# Patient Record
Sex: Female | Born: 1935 | Race: White | Hispanic: No | Marital: Married
Health system: Southern US, Community
[De-identification: ages and names within clinical notes are randomized; demographics above are authoritative.]

## PROBLEM LIST (undated history)

## (undated) HISTORY — PX: BREAST BIOPSY: SHX20

---

## 1997-12-07 ENCOUNTER — Ambulatory Visit (HOSPITAL_BASED_OUTPATIENT_CLINIC_OR_DEPARTMENT_OTHER): Admission: RE | Admit: 1997-12-07 | Discharge: 1997-12-07 | Payer: Self-pay | Admitting: Orthopedic Surgery

## 1998-05-04 ENCOUNTER — Other Ambulatory Visit: Admission: RE | Admit: 1998-05-04 | Discharge: 1998-05-04 | Payer: Self-pay | Admitting: Obstetrics & Gynecology

## 1998-08-23 ENCOUNTER — Ambulatory Visit (HOSPITAL_BASED_OUTPATIENT_CLINIC_OR_DEPARTMENT_OTHER): Admission: RE | Admit: 1998-08-23 | Discharge: 1998-08-23 | Payer: Self-pay | Admitting: Orthopedic Surgery

## 1999-01-10 ENCOUNTER — Other Ambulatory Visit: Admission: RE | Admit: 1999-01-10 | Discharge: 1999-01-10 | Payer: Self-pay | Admitting: Obstetrics & Gynecology

## 1999-06-06 ENCOUNTER — Encounter: Admission: RE | Admit: 1999-06-06 | Discharge: 1999-06-06 | Payer: Self-pay | Admitting: Obstetrics & Gynecology

## 1999-06-06 ENCOUNTER — Encounter: Payer: Self-pay | Admitting: Obstetrics & Gynecology

## 2000-03-20 ENCOUNTER — Other Ambulatory Visit: Admission: RE | Admit: 2000-03-20 | Discharge: 2000-03-20 | Payer: Self-pay | Admitting: Obstetrics & Gynecology

## 2000-07-26 ENCOUNTER — Encounter: Payer: Self-pay | Admitting: Obstetrics & Gynecology

## 2000-07-26 ENCOUNTER — Encounter: Admission: RE | Admit: 2000-07-26 | Discharge: 2000-07-26 | Payer: Self-pay | Admitting: Obstetrics & Gynecology

## 2000-09-27 ENCOUNTER — Ambulatory Visit (HOSPITAL_BASED_OUTPATIENT_CLINIC_OR_DEPARTMENT_OTHER): Admission: RE | Admit: 2000-09-27 | Discharge: 2000-09-27 | Payer: Self-pay | Admitting: Orthopedic Surgery

## 2001-06-10 ENCOUNTER — Other Ambulatory Visit: Admission: RE | Admit: 2001-06-10 | Discharge: 2001-06-10 | Payer: Self-pay | Admitting: Obstetrics & Gynecology

## 2001-08-26 ENCOUNTER — Encounter: Payer: Self-pay | Admitting: Obstetrics & Gynecology

## 2001-08-26 ENCOUNTER — Encounter: Admission: RE | Admit: 2001-08-26 | Discharge: 2001-08-26 | Payer: Self-pay | Admitting: Obstetrics & Gynecology

## 2002-06-16 ENCOUNTER — Other Ambulatory Visit: Admission: RE | Admit: 2002-06-16 | Discharge: 2002-06-16 | Payer: Self-pay | Admitting: Obstetrics & Gynecology

## 2002-09-15 ENCOUNTER — Encounter: Admission: RE | Admit: 2002-09-15 | Discharge: 2002-09-15 | Payer: Self-pay | Admitting: Obstetrics & Gynecology

## 2002-09-15 ENCOUNTER — Encounter: Payer: Self-pay | Admitting: Obstetrics & Gynecology

## 2003-10-05 ENCOUNTER — Encounter: Admission: RE | Admit: 2003-10-05 | Discharge: 2003-10-05 | Payer: Self-pay | Admitting: Obstetrics & Gynecology

## 2004-12-13 ENCOUNTER — Other Ambulatory Visit: Admission: RE | Admit: 2004-12-13 | Discharge: 2004-12-13 | Payer: Self-pay | Admitting: Obstetrics & Gynecology

## 2004-12-18 ENCOUNTER — Encounter: Admission: RE | Admit: 2004-12-18 | Discharge: 2004-12-18 | Payer: Self-pay | Admitting: Obstetrics & Gynecology

## 2005-01-18 ENCOUNTER — Encounter: Admission: RE | Admit: 2005-01-18 | Discharge: 2005-01-18 | Payer: Self-pay | Admitting: Obstetrics & Gynecology

## 2005-12-07 ENCOUNTER — Encounter: Admission: RE | Admit: 2005-12-07 | Discharge: 2005-12-07 | Payer: Self-pay | Admitting: Obstetrics & Gynecology

## 2005-12-11 ENCOUNTER — Encounter: Admission: RE | Admit: 2005-12-11 | Discharge: 2005-12-11 | Payer: Self-pay | Admitting: Obstetrics & Gynecology

## 2005-12-11 ENCOUNTER — Encounter (INDEPENDENT_AMBULATORY_CARE_PROVIDER_SITE_OTHER): Payer: Self-pay | Admitting: Specialist

## 2005-12-19 ENCOUNTER — Encounter: Admission: RE | Admit: 2005-12-19 | Discharge: 2005-12-19 | Payer: Self-pay | Admitting: Obstetrics & Gynecology

## 2005-12-26 ENCOUNTER — Encounter: Admission: RE | Admit: 2005-12-26 | Discharge: 2005-12-26 | Payer: Self-pay | Admitting: General Surgery

## 2006-01-03 ENCOUNTER — Encounter: Admission: RE | Admit: 2006-01-03 | Discharge: 2006-01-03 | Payer: Self-pay | Admitting: General Surgery

## 2006-01-15 ENCOUNTER — Encounter: Admission: RE | Admit: 2006-01-15 | Discharge: 2006-01-15 | Payer: Self-pay | Admitting: General Surgery

## 2006-01-15 ENCOUNTER — Encounter (INDEPENDENT_AMBULATORY_CARE_PROVIDER_SITE_OTHER): Payer: Self-pay | Admitting: *Deleted

## 2006-01-15 ENCOUNTER — Other Ambulatory Visit: Admission: RE | Admit: 2006-01-15 | Discharge: 2006-01-15 | Payer: Self-pay | Admitting: Diagnostic Radiology

## 2006-02-15 ENCOUNTER — Ambulatory Visit (HOSPITAL_BASED_OUTPATIENT_CLINIC_OR_DEPARTMENT_OTHER): Admission: RE | Admit: 2006-02-15 | Discharge: 2006-02-15 | Payer: Self-pay | Admitting: Orthopedic Surgery

## 2006-05-15 ENCOUNTER — Encounter: Admission: RE | Admit: 2006-05-15 | Discharge: 2006-05-15 | Payer: Self-pay | Admitting: General Surgery

## 2006-11-22 ENCOUNTER — Encounter: Admission: RE | Admit: 2006-11-22 | Discharge: 2006-11-22 | Payer: Self-pay | Admitting: General Surgery

## 2006-12-12 ENCOUNTER — Encounter: Admission: RE | Admit: 2006-12-12 | Discharge: 2006-12-12 | Payer: Self-pay | Admitting: Obstetrics & Gynecology

## 2007-07-01 ENCOUNTER — Encounter: Admission: RE | Admit: 2007-07-01 | Discharge: 2007-07-01 | Payer: Self-pay | Admitting: Geriatric Medicine

## 2008-01-07 ENCOUNTER — Encounter: Admission: RE | Admit: 2008-01-07 | Discharge: 2008-01-07 | Payer: Self-pay | Admitting: Obstetrics & Gynecology

## 2008-07-06 ENCOUNTER — Encounter: Admission: RE | Admit: 2008-07-06 | Discharge: 2008-07-06 | Payer: Self-pay | Admitting: Geriatric Medicine

## 2008-07-28 ENCOUNTER — Encounter: Admission: RE | Admit: 2008-07-28 | Discharge: 2008-07-28 | Payer: Self-pay | Admitting: Geriatric Medicine

## 2008-07-28 ENCOUNTER — Encounter (INDEPENDENT_AMBULATORY_CARE_PROVIDER_SITE_OTHER): Payer: Self-pay | Admitting: Interventional Radiology

## 2008-07-28 ENCOUNTER — Other Ambulatory Visit: Admission: RE | Admit: 2008-07-28 | Discharge: 2008-07-28 | Payer: Self-pay | Admitting: Interventional Radiology

## 2009-01-26 ENCOUNTER — Encounter: Admission: RE | Admit: 2009-01-26 | Discharge: 2009-01-26 | Payer: Self-pay | Admitting: Geriatric Medicine

## 2009-07-08 ENCOUNTER — Encounter: Admission: RE | Admit: 2009-07-08 | Discharge: 2009-07-08 | Payer: Self-pay | Admitting: Geriatric Medicine

## 2010-01-26 ENCOUNTER — Other Ambulatory Visit: Payer: Self-pay | Admitting: Geriatric Medicine

## 2010-01-26 DIAGNOSIS — Z1239 Encounter for other screening for malignant neoplasm of breast: Secondary | ICD-10-CM

## 2010-02-01 ENCOUNTER — Ambulatory Visit
Admission: RE | Admit: 2010-02-01 | Discharge: 2010-02-01 | Disposition: A | Discharge status: Home / Self Care | Payer: MEDICARE | Source: Ambulatory Visit | Attending: Geriatric Medicine | Admitting: Geriatric Medicine

## 2010-02-01 DIAGNOSIS — Z1239 Encounter for other screening for malignant neoplasm of breast: Secondary | ICD-10-CM

## 2010-05-19 NOTE — Op Note (Signed)
NAMELORELLA, GOMEZ            ACCOUNT NO.:  [id number]   MEDICAL RECORD NO.:  [id number]          PATIENT TYPE:  AMB   LOCATION:  DSC                          FACILITY:  [facility]   PHYSICIAN:  Katy Fitch. Sypher, M.D. DATE OF BIRTH:  1934/10/01   DATE OF PROCEDURE:  02/15/2006  DATE OF DISCHARGE:                               OPERATIVE REPORT   PREOPERATIVE DIAGNOSIS:  Chronic stenosing tenosynovitis, left first  dorsal compartment.   POSTOPERATIVE DIAGNOSIS:  Chronic stenosing tenosynovitis, left first  dorsal compartment.   OPERATION:  Release of first dorsal compartment.   OPERATIONS:  Katy Fitch. Sypher, MD   ASSISTANT:  Annye Rusk PA-C   ANESTHESIA:  2% lidocaine field block of left first dorsal compartment  supplemented by IV sedation, supervising anesthesiologist is Dr. Gypsy Balsam.   INDICATIONS:  Mackenzie Shaw is a 76 year old woman referred for  evaluation and management of a painful left wrist.  Clinical examination  revealed swelling of the first dorsal compartment.  Due to a failure to  respond to nonoperative measures, she is now brought to the operating  room for release of her left first dorsal compartment.   PROCEDURE:  Jenel Gierke was brought to the operating room and  placed in supine position on the table.   Following the provision of light sedation, the left arm was prepped with  Betadine soap and solution and sterilely draped.  Lidocaine 2% was  infiltrated into the skin proximal to the area of intended incision and  the first dorsal compartment infiltrated directly with 2% lidocaine.   After 5 minutes, excellent anesthesia was achieved.  The arm was  exsanguinated with an Esmarch bandage and an arterial tourniquet on the  proximal brachium inflated to 230 mmHg.   The procedure commenced with short transverse incision directly over the  thickened first dorsal compartment.  The subcutaneous tissues were  carefully divided taking care, taking care  to use a Freer to gently  elevate the radial superficial sensory branches.   The compartment was split longitudinally and released proximally and  distally.  The wall thickness was increased to 3 mm.   There were two slips of the abductor pollicis longus noted and a single  slip of the extensor pollicis brevis.  There was a septum between the  APL and EPB tendons.  The septum was resected.  Thereafter free range of  motion of the wrist and thumb was achieved.   The wound was then repaired with intradermal 3-0 Prolene and Steri-  Strips.  There were no apparent complications.   For aftercare Ms. Rahal is placed in an Ace wrap.  She is advised to  begin immediate range of motion excises.  We will see her back in follow-  up in 1 week for suture removal.      Katy Fitch. Sypher, M.D.  Electronically Signed     RVS/MEDQ  D:  02/15/2006  T:  02/15/2006  Job:  [id number]

## 2010-05-19 NOTE — Op Note (Signed)
[place]  Patient:    Mackenzie Shaw, Mackenzie Shaw Visit Number: [id number] MRN: [id number]          Service Type: DSU Location: [facility] Attending Physician:  Susa Day Dictated by:   Katy Fitch Naaman Plummer., M.D. Proc. Date: 09/27/00 Admit Date:  09/27/2000   CC:         Katy Fitch. Sypher, Montez Hageman., M.D. 2 copies   Operative Report  PREOPERATIVE DIAGNOSIS:  Chronic stenosing tenosynovitis of the right first dorsal compartment unresponsive to activity modifications, splinting, and anti-inflammatory medications.  POSTOPERATIVE DIAGNOSIS:  Chronic stenosing tenosynovitis of the right first dorsal compartment unresponsive to activity modifications, splinting, and anti-inflammatory medications.  OPERATION:  Release of right first dorsal compartment.  SURGEON:  Katy Fitch. Sypher, Montez Hageman., M.D.  ASSISTANT:  Molly Maduro ______, P.A.-C  ANESTHESIA:  Marcaine 0.25% and 2% lidocaine field block of first dorsal compartment, supplemented with IV sedation, Dr. Gelene Mink.  INDICATIONS FOR PROCEDURE:  Mackenzie Shaw is a 76 year old woman with a history of chronic stenosing tenosynovitis involving the right first dorsal compartment.  She has not responded to non-operative measures, therefore, she is brought to the operating room at this time for release of the first dorsal compartment.  DESCRIPTION OF PROCEDURE:  Mackenzie Shaw is brought to the operating room and placed in supine position on the operating table.  Following light sedation, the right arm was prepped with Betadine solution and sterilely draped.  Following exsanguination of the limb with the Esmarch bandage, the tourniquet was inflated to 240 mmHg.  Procedure commenced with a short incision directly over the palpably enlarged compartment.  Subcutaneous tissues were carefully dividing, taking care to identify and gently retract the radial sensory branches.  The compartment was quite swollen,  fibrotic, and covered with an inflammatory tenosynovium.  This was cleared with the [facility] elevator followed by the use of the scalpel and scissors to release the first dorsal compartment.  There was a minimal septum between the abductor pollicis longus tendon slips and the extensor pollicis brevis.  Once the compartment was released, there was noted to be marked instriction of the abductor pollicis tendon slips.  The wound was inspected for bleeding points.  Care was taken to visualize and carefully protect the radial sensory branches throughout dissection.  The wound was then repaired with Prolene suture.  A compressive dressing was applied with xeroform, sterile gauze, and Ace wrap.  Mackenzie Shaw is advised to begin immediate range of motion exercises.  She will return to the office in 7 to 10 days for suture removal, and advancement to a therapy program. Dictated by:   Katy Fitch. Naaman Plummer., M.D. Attending Physician:  Susa Day DD:  09/27/00 TD:  09/27/00 Job: [id number]

## 2010-07-11 ENCOUNTER — Other Ambulatory Visit: Payer: Self-pay | Admitting: Geriatric Medicine

## 2010-07-11 DIAGNOSIS — E041 Nontoxic single thyroid nodule: Secondary | ICD-10-CM

## 2010-07-24 ENCOUNTER — Ambulatory Visit
Admission: RE | Admit: 2010-07-24 | Discharge: 2010-07-24 | Disposition: A | Discharge status: Home / Self Care | Payer: Medicare Other | Source: Ambulatory Visit | Attending: Geriatric Medicine | Admitting: Geriatric Medicine

## 2010-07-24 DIAGNOSIS — E041 Nontoxic single thyroid nodule: Secondary | ICD-10-CM

## 2011-01-22 ENCOUNTER — Ambulatory Visit (INDEPENDENT_AMBULATORY_CARE_PROVIDER_SITE_OTHER): Payer: Medicare Other

## 2011-01-22 DIAGNOSIS — R05 Cough: Secondary | ICD-10-CM

## 2011-01-22 DIAGNOSIS — J019 Acute sinusitis, unspecified: Secondary | ICD-10-CM

## 2011-01-22 DIAGNOSIS — J069 Acute upper respiratory infection, unspecified: Secondary | ICD-10-CM

## 2011-05-08 ENCOUNTER — Other Ambulatory Visit: Payer: Self-pay

## 2011-07-12 ENCOUNTER — Other Ambulatory Visit (HOSPITAL_COMMUNITY)
Admission: RE | Admit: 2011-07-12 | Discharge: 2011-07-12 | Disposition: A | Discharge status: Home / Self Care | Payer: Medicare Other | Source: Ambulatory Visit | Attending: Geriatric Medicine | Admitting: Geriatric Medicine

## 2011-07-12 ENCOUNTER — Other Ambulatory Visit: Payer: Self-pay | Admitting: Geriatric Medicine

## 2011-07-12 DIAGNOSIS — Z1151 Encounter for screening for human papillomavirus (HPV): Secondary | ICD-10-CM | POA: Insufficient documentation

## 2011-07-12 DIAGNOSIS — Z01419 Encounter for gynecological examination (general) (routine) without abnormal findings: Secondary | ICD-10-CM | POA: Insufficient documentation

## 2011-07-13 ENCOUNTER — Other Ambulatory Visit: Payer: Self-pay | Admitting: Geriatric Medicine

## 2011-07-13 DIAGNOSIS — E041 Nontoxic single thyroid nodule: Secondary | ICD-10-CM

## 2011-07-17 ENCOUNTER — Ambulatory Visit
Admission: RE | Admit: 2011-07-17 | Discharge: 2011-07-17 | Disposition: A | Discharge status: Home / Self Care | Payer: Medicare Other | Source: Ambulatory Visit | Attending: Geriatric Medicine | Admitting: Geriatric Medicine

## 2011-07-17 DIAGNOSIS — E041 Nontoxic single thyroid nodule: Secondary | ICD-10-CM

## 2011-08-06 ENCOUNTER — Other Ambulatory Visit: Payer: Self-pay | Admitting: Geriatric Medicine

## 2011-08-06 DIAGNOSIS — Z1231 Encounter for screening mammogram for malignant neoplasm of breast: Secondary | ICD-10-CM

## 2011-08-22 ENCOUNTER — Ambulatory Visit
Admission: RE | Admit: 2011-08-22 | Discharge: 2011-08-22 | Disposition: A | Discharge status: Home / Self Care | Payer: Medicare Other | Source: Ambulatory Visit | Attending: Geriatric Medicine | Admitting: Geriatric Medicine

## 2011-08-22 ENCOUNTER — Other Ambulatory Visit: Payer: Self-pay | Admitting: Geriatric Medicine

## 2011-08-22 DIAGNOSIS — Z1231 Encounter for screening mammogram for malignant neoplasm of breast: Secondary | ICD-10-CM

## 2011-10-25 ENCOUNTER — Other Ambulatory Visit: Payer: Self-pay | Admitting: *Deleted

## 2012-01-24 ENCOUNTER — Other Ambulatory Visit: Payer: Self-pay | Admitting: Oncology

## 2012-07-15 ENCOUNTER — Other Ambulatory Visit: Payer: Self-pay | Admitting: Geriatric Medicine

## 2012-07-15 DIAGNOSIS — E041 Nontoxic single thyroid nodule: Secondary | ICD-10-CM

## 2012-07-17 ENCOUNTER — Ambulatory Visit
Admission: RE | Admit: 2012-07-17 | Discharge: 2012-07-17 | Disposition: A | Discharge status: Home / Self Care | Payer: Medicare Other | Source: Ambulatory Visit | Attending: Geriatric Medicine | Admitting: Geriatric Medicine

## 2012-07-17 DIAGNOSIS — E041 Nontoxic single thyroid nodule: Secondary | ICD-10-CM

## 2012-11-06 ENCOUNTER — Other Ambulatory Visit: Payer: Self-pay

## 2012-11-06 DIAGNOSIS — Z1231 Encounter for screening mammogram for malignant neoplasm of breast: Secondary | ICD-10-CM

## 2012-11-26 ENCOUNTER — Ambulatory Visit: Payer: Medicare Other

## 2012-12-11 ENCOUNTER — Ambulatory Visit
Admission: RE | Admit: 2012-12-11 | Discharge: 2012-12-11 | Disposition: A | Discharge status: Home / Self Care | Payer: Medicare Other | Source: Ambulatory Visit

## 2012-12-11 DIAGNOSIS — Z1231 Encounter for screening mammogram for malignant neoplasm of breast: Secondary | ICD-10-CM

## 2012-12-30 ENCOUNTER — Other Ambulatory Visit: Payer: Self-pay | Admitting: Oncology

## 2013-07-24 ENCOUNTER — Other Ambulatory Visit: Payer: Self-pay | Admitting: Geriatric Medicine

## 2013-07-24 DIAGNOSIS — E041 Nontoxic single thyroid nodule: Secondary | ICD-10-CM

## 2013-07-30 ENCOUNTER — Ambulatory Visit
Admission: RE | Admit: 2013-07-30 | Discharge: 2013-07-30 | Disposition: A | Discharge status: Home / Self Care | Payer: Medicare Other | Source: Ambulatory Visit | Attending: Geriatric Medicine | Admitting: Geriatric Medicine

## 2013-07-30 DIAGNOSIS — E041 Nontoxic single thyroid nodule: Secondary | ICD-10-CM

## 2014-07-14 ENCOUNTER — Other Ambulatory Visit: Payer: Self-pay

## 2014-07-14 DIAGNOSIS — Z803 Family history of malignant neoplasm of breast: Secondary | ICD-10-CM

## 2014-07-14 DIAGNOSIS — Z1231 Encounter for screening mammogram for malignant neoplasm of breast: Secondary | ICD-10-CM

## 2014-07-21 ENCOUNTER — Ambulatory Visit
Admission: RE | Admit: 2014-07-21 | Discharge: 2014-07-21 | Disposition: A | Discharge status: Home / Self Care | Payer: PPO | Source: Ambulatory Visit

## 2014-07-21 DIAGNOSIS — Z803 Family history of malignant neoplasm of breast: Secondary | ICD-10-CM

## 2014-07-21 DIAGNOSIS — Z1231 Encounter for screening mammogram for malignant neoplasm of breast: Secondary | ICD-10-CM

## 2015-07-21 DIAGNOSIS — S5001XA Contusion of right elbow, initial encounter: Secondary | ICD-10-CM | POA: Diagnosis not present

## 2015-07-21 DIAGNOSIS — S50319A Abrasion of unspecified elbow, initial encounter: Secondary | ICD-10-CM | POA: Diagnosis not present

## 2015-07-21 DIAGNOSIS — S0100XA Unspecified open wound of scalp, initial encounter: Secondary | ICD-10-CM | POA: Diagnosis not present

## 2015-07-21 DIAGNOSIS — M79644 Pain in right finger(s): Secondary | ICD-10-CM | POA: Diagnosis not present

## 2015-07-28 DIAGNOSIS — S60419D Abrasion of unspecified finger, subsequent encounter: Secondary | ICD-10-CM | POA: Diagnosis not present

## 2015-07-28 DIAGNOSIS — S66911D Strain of unspecified muscle, fascia and tendon at wrist and hand level, right hand, subsequent encounter: Secondary | ICD-10-CM | POA: Diagnosis not present

## 2015-07-28 DIAGNOSIS — S0100XD Unspecified open wound of scalp, subsequent encounter: Secondary | ICD-10-CM | POA: Diagnosis not present

## 2015-08-01 ENCOUNTER — Other Ambulatory Visit: Payer: Self-pay | Admitting: Geriatric Medicine

## 2015-08-01 DIAGNOSIS — D7589 Other specified diseases of blood and blood-forming organs: Secondary | ICD-10-CM | POA: Diagnosis not present

## 2015-08-01 DIAGNOSIS — Z23 Encounter for immunization: Secondary | ICD-10-CM | POA: Diagnosis not present

## 2015-08-01 DIAGNOSIS — E041 Nontoxic single thyroid nodule: Secondary | ICD-10-CM

## 2015-08-01 DIAGNOSIS — M542 Cervicalgia: Secondary | ICD-10-CM | POA: Diagnosis not present

## 2015-08-01 DIAGNOSIS — Z0000 Encounter for general adult medical examination without abnormal findings: Secondary | ICD-10-CM | POA: Diagnosis not present

## 2015-08-01 DIAGNOSIS — Z1389 Encounter for screening for other disorder: Secondary | ICD-10-CM | POA: Diagnosis not present

## 2015-08-01 DIAGNOSIS — Z79899 Other long term (current) drug therapy: Secondary | ICD-10-CM | POA: Diagnosis not present

## 2015-08-23 ENCOUNTER — Ambulatory Visit
Admission: RE | Admit: 2015-08-23 | Discharge: 2015-08-23 | Disposition: A | Discharge status: Home / Self Care | Payer: PPO | Source: Ambulatory Visit | Attending: Geriatric Medicine | Admitting: Geriatric Medicine

## 2015-08-23 DIAGNOSIS — E041 Nontoxic single thyroid nodule: Secondary | ICD-10-CM | POA: Diagnosis not present

## 2015-08-26 DIAGNOSIS — H04123 Dry eye syndrome of bilateral lacrimal glands: Secondary | ICD-10-CM | POA: Diagnosis not present

## 2015-09-16 DIAGNOSIS — N3941 Urge incontinence: Secondary | ICD-10-CM | POA: Diagnosis not present

## 2015-09-16 DIAGNOSIS — N3281 Overactive bladder: Secondary | ICD-10-CM | POA: Diagnosis not present

## 2015-09-20 DIAGNOSIS — M79644 Pain in right finger(s): Secondary | ICD-10-CM | POA: Diagnosis not present

## 2015-10-13 DIAGNOSIS — Z23 Encounter for immunization: Secondary | ICD-10-CM | POA: Diagnosis not present

## 2016-02-06 DIAGNOSIS — H524 Presbyopia: Secondary | ICD-10-CM | POA: Diagnosis not present

## 2016-02-06 DIAGNOSIS — H04123 Dry eye syndrome of bilateral lacrimal glands: Secondary | ICD-10-CM | POA: Diagnosis not present

## 2016-02-06 DIAGNOSIS — D3131 Benign neoplasm of right choroid: Secondary | ICD-10-CM | POA: Diagnosis not present

## 2016-03-19 DIAGNOSIS — R238 Other skin changes: Secondary | ICD-10-CM | POA: Diagnosis not present

## 2016-03-19 DIAGNOSIS — S61210A Laceration without foreign body of right index finger without damage to nail, initial encounter: Secondary | ICD-10-CM | POA: Diagnosis not present

## 2016-05-25 DIAGNOSIS — N3281 Overactive bladder: Secondary | ICD-10-CM | POA: Diagnosis not present

## 2016-05-25 DIAGNOSIS — N3941 Urge incontinence: Secondary | ICD-10-CM | POA: Diagnosis not present

## 2016-08-08 ENCOUNTER — Other Ambulatory Visit: Payer: Self-pay | Admitting: Geriatric Medicine

## 2016-08-08 DIAGNOSIS — E041 Nontoxic single thyroid nodule: Secondary | ICD-10-CM | POA: Diagnosis not present

## 2016-08-08 DIAGNOSIS — L989 Disorder of the skin and subcutaneous tissue, unspecified: Secondary | ICD-10-CM | POA: Diagnosis not present

## 2016-08-08 DIAGNOSIS — Z0000 Encounter for general adult medical examination without abnormal findings: Secondary | ICD-10-CM | POA: Diagnosis not present

## 2016-08-08 DIAGNOSIS — Z1389 Encounter for screening for other disorder: Secondary | ICD-10-CM | POA: Diagnosis not present

## 2016-08-08 DIAGNOSIS — Z79899 Other long term (current) drug therapy: Secondary | ICD-10-CM | POA: Diagnosis not present

## 2016-08-08 DIAGNOSIS — D7589 Other specified diseases of blood and blood-forming organs: Secondary | ICD-10-CM | POA: Diagnosis not present

## 2016-08-08 DIAGNOSIS — Z23 Encounter for immunization: Secondary | ICD-10-CM | POA: Diagnosis not present

## 2016-08-10 ENCOUNTER — Ambulatory Visit
Admission: RE | Admit: 2016-08-10 | Discharge: 2016-08-10 | Disposition: A | Discharge status: Home / Self Care | Payer: PPO | Source: Ambulatory Visit | Attending: Geriatric Medicine | Admitting: Geriatric Medicine

## 2016-08-10 DIAGNOSIS — E041 Nontoxic single thyroid nodule: Secondary | ICD-10-CM

## 2016-08-10 DIAGNOSIS — E042 Nontoxic multinodular goiter: Secondary | ICD-10-CM | POA: Diagnosis not present

## 2016-08-20 ENCOUNTER — Other Ambulatory Visit: Payer: Self-pay | Admitting: Geriatric Medicine

## 2016-08-20 DIAGNOSIS — Z1231 Encounter for screening mammogram for malignant neoplasm of breast: Secondary | ICD-10-CM

## 2016-08-21 DIAGNOSIS — Z85828 Personal history of other malignant neoplasm of skin: Secondary | ICD-10-CM | POA: Diagnosis not present

## 2016-08-21 DIAGNOSIS — L570 Actinic keratosis: Secondary | ICD-10-CM | POA: Diagnosis not present

## 2016-08-29 ENCOUNTER — Ambulatory Visit
Admission: RE | Admit: 2016-08-29 | Discharge: 2016-08-29 | Disposition: A | Discharge status: Home / Self Care | Payer: PPO | Source: Ambulatory Visit | Attending: Geriatric Medicine | Admitting: Geriatric Medicine

## 2016-08-29 DIAGNOSIS — Z1231 Encounter for screening mammogram for malignant neoplasm of breast: Secondary | ICD-10-CM

## 2016-09-13 DIAGNOSIS — Z23 Encounter for immunization: Secondary | ICD-10-CM | POA: Diagnosis not present

## 2016-10-24 ENCOUNTER — Ambulatory Visit
Admission: RE | Admit: 2016-10-24 | Discharge: 2016-10-24 | Disposition: A | Discharge status: Home / Self Care | Payer: PPO | Source: Ambulatory Visit | Attending: Geriatric Medicine | Admitting: Geriatric Medicine

## 2016-10-24 ENCOUNTER — Other Ambulatory Visit: Payer: Self-pay | Admitting: Geriatric Medicine

## 2016-10-24 DIAGNOSIS — F4329 Adjustment disorder with other symptoms: Secondary | ICD-10-CM | POA: Diagnosis not present

## 2016-10-24 DIAGNOSIS — R05 Cough: Secondary | ICD-10-CM

## 2016-10-24 DIAGNOSIS — R059 Cough, unspecified: Secondary | ICD-10-CM

## 2017-08-14 DIAGNOSIS — E039 Hypothyroidism, unspecified: Secondary | ICD-10-CM | POA: Diagnosis not present

## 2017-08-14 DIAGNOSIS — K219 Gastro-esophageal reflux disease without esophagitis: Secondary | ICD-10-CM | POA: Diagnosis not present

## 2017-08-14 DIAGNOSIS — D7589 Other specified diseases of blood and blood-forming organs: Secondary | ICD-10-CM | POA: Diagnosis not present

## 2017-08-14 DIAGNOSIS — Z0000 Encounter for general adult medical examination without abnormal findings: Secondary | ICD-10-CM | POA: Diagnosis not present

## 2017-08-14 DIAGNOSIS — Z79899 Other long term (current) drug therapy: Secondary | ICD-10-CM | POA: Diagnosis not present

## 2017-08-14 DIAGNOSIS — Z780 Asymptomatic menopausal state: Secondary | ICD-10-CM | POA: Diagnosis not present

## 2017-08-14 DIAGNOSIS — E041 Nontoxic single thyroid nodule: Secondary | ICD-10-CM | POA: Diagnosis not present

## 2017-08-14 DIAGNOSIS — Z1389 Encounter for screening for other disorder: Secondary | ICD-10-CM | POA: Diagnosis not present

## 2017-09-10 ENCOUNTER — Ambulatory Visit: Payer: Self-pay

## 2017-09-10 DIAGNOSIS — J0100 Acute maxillary sinusitis, unspecified: Secondary | ICD-10-CM | POA: Diagnosis not present

## 2017-09-13 ENCOUNTER — Other Ambulatory Visit: Payer: Self-pay

## 2017-09-13 NOTE — Patient Outreach (Signed)
[facility]) Care Management  09/13/2017  BROOKELYNN HAMOR Apr 08, 1934 [id number]  Nurse Call Line Referral Date: 09/09/17 Reason for Referral headache, sore throat Nurse call line recommendation: see doctor within 24 hours Attempt #1  Telephone call to patient regarding referral. Unable to reach patient. HIPAA compliant voice message left with call back phone number.   PLAN: RNCM will attempt 2nd telephone call to patient within 4 business days. RNCM will send outreach letter.   Quinn Plowman RN,BSN, CCM [facility] Telephonic

## 2017-09-16 ENCOUNTER — Other Ambulatory Visit: Payer: Self-pay

## 2017-09-16 NOTE — Patient Outreach (Signed)
[facility]) Care Management  09/16/2017  DRENDA SOBECKI 09/16/1934 [id number]  Nurse Call Line Referral Date: 09/16/17 Reason for referral: headache, sore throat Nurse call line recommendation: see doctor within 24 hours  Telephone call to patient regarding nurse call line follow up. HIPAA verified with patient. Explained reason for call. Patient states she was visiting out of states and returned home with symptoms.  Patient states she saw doctor [place] the day she returned from traveling and was prescribed Augmentin. Patient states she has 1 pill left.  Patient reports she is doing much better.  She reports she was diagnosed with sinusitis and bronchitis.  Patient denies any ongoing symptoms. Denies any further needs at this time.  RNCM advised patient to follow up with her doctor and/ or nurse call line as needed. Patient verbalized understanding.   PLAN: RNCM will close patient due to patient being assessed and having no further needs RNCM will send patients doctor closure notification .   Quinn Plowman RN,BSN,CCM [facility] Telephonic  [phone]

## 2017-09-17 ENCOUNTER — Ambulatory Visit: Payer: Self-pay

## 2017-09-24 DIAGNOSIS — Z23 Encounter for immunization: Secondary | ICD-10-CM | POA: Diagnosis not present

## 2017-10-08 DIAGNOSIS — Z780 Asymptomatic menopausal state: Secondary | ICD-10-CM | POA: Diagnosis not present

## 2017-10-08 DIAGNOSIS — M8588 Other specified disorders of bone density and structure, other site: Secondary | ICD-10-CM | POA: Diagnosis not present

## 2017-10-30 DIAGNOSIS — M8588 Other specified disorders of bone density and structure, other site: Secondary | ICD-10-CM | POA: Diagnosis not present

## 2017-12-28 DIAGNOSIS — J209 Acute bronchitis, unspecified: Secondary | ICD-10-CM | POA: Diagnosis not present

## 2018-06-17 DIAGNOSIS — L237 Allergic contact dermatitis due to plants, except food: Secondary | ICD-10-CM | POA: Diagnosis not present

## 2018-06-17 DIAGNOSIS — L570 Actinic keratosis: Secondary | ICD-10-CM | POA: Diagnosis not present

## 2018-06-17 DIAGNOSIS — Z85828 Personal history of other malignant neoplasm of skin: Secondary | ICD-10-CM | POA: Diagnosis not present

## 2018-08-22 DIAGNOSIS — Z0000 Encounter for general adult medical examination without abnormal findings: Secondary | ICD-10-CM | POA: Diagnosis not present

## 2018-08-22 DIAGNOSIS — D7589 Other specified diseases of blood and blood-forming organs: Secondary | ICD-10-CM | POA: Diagnosis not present

## 2018-08-22 DIAGNOSIS — E041 Nontoxic single thyroid nodule: Secondary | ICD-10-CM | POA: Diagnosis not present

## 2018-08-22 DIAGNOSIS — Z1389 Encounter for screening for other disorder: Secondary | ICD-10-CM | POA: Diagnosis not present

## 2018-08-22 DIAGNOSIS — I83811 Varicose veins of right lower extremities with pain: Secondary | ICD-10-CM | POA: Diagnosis not present

## 2018-08-22 DIAGNOSIS — E039 Hypothyroidism, unspecified: Secondary | ICD-10-CM | POA: Diagnosis not present

## 2018-08-22 DIAGNOSIS — I7300 Raynaud's syndrome without gangrene: Secondary | ICD-10-CM | POA: Diagnosis not present

## 2018-08-22 DIAGNOSIS — Z79899 Other long term (current) drug therapy: Secondary | ICD-10-CM | POA: Diagnosis not present

## 2018-09-09 DIAGNOSIS — E039 Hypothyroidism, unspecified: Secondary | ICD-10-CM | POA: Diagnosis not present

## 2018-09-09 DIAGNOSIS — Z79899 Other long term (current) drug therapy: Secondary | ICD-10-CM | POA: Diagnosis not present

## 2018-10-08 DIAGNOSIS — H2000 Unspecified acute and subacute iridocyclitis: Secondary | ICD-10-CM | POA: Diagnosis not present

## 2018-10-16 DIAGNOSIS — Z23 Encounter for immunization: Secondary | ICD-10-CM | POA: Diagnosis not present

## 2018-10-22 DIAGNOSIS — H2000 Unspecified acute and subacute iridocyclitis: Secondary | ICD-10-CM | POA: Diagnosis not present

## 2019-01-05 DIAGNOSIS — Z03818 Encounter for observation for suspected exposure to other biological agents ruled out: Secondary | ICD-10-CM | POA: Diagnosis not present

## 2019-01-07 DIAGNOSIS — Z03818 Encounter for observation for suspected exposure to other biological agents ruled out: Secondary | ICD-10-CM | POA: Diagnosis not present

## 2019-01-25 ENCOUNTER — Ambulatory Visit: Payer: PPO | Attending: Internal Medicine

## 2019-01-25 DIAGNOSIS — Z23 Encounter for immunization: Secondary | ICD-10-CM | POA: Insufficient documentation

## 2019-01-25 NOTE — Progress Notes (Signed)
   Covid-19 Vaccination Clinic  Name:  Mackenzie Shaw    MRN: [id number] DOB: August 17, 1934  01/25/2019  [name] was observed post Covid-19 immunization for 15 minutes without incidence. She was provided with Vaccine Information Sheet and instruction to access the V-Safe system.   [name] was instructed to call 911 with any severe reactions post vaccine: Marland Kitchen Difficulty breathing  . Swelling of your face and throat  . A fast heartbeat  . A bad rash all over your body  . Dizziness and weakness    Immunizations Administered    Name Date Dose VIS Date Route   Pfizer COVID-19 Vaccine 01/25/2019 10:59 AM 0.3 mL 12/12/2018 Intramuscular   Manufacturer: [place]   Lot: [id number]   [place]: [id number]

## 2019-01-26 DIAGNOSIS — Z03818 Encounter for observation for suspected exposure to other biological agents ruled out: Secondary | ICD-10-CM | POA: Diagnosis not present

## 2019-02-02 DIAGNOSIS — Z03818 Encounter for observation for suspected exposure to other biological agents ruled out: Secondary | ICD-10-CM | POA: Diagnosis not present

## 2019-02-16 ENCOUNTER — Ambulatory Visit: Payer: PPO | Attending: Internal Medicine

## 2019-02-16 DIAGNOSIS — Z961 Presence of intraocular lens: Secondary | ICD-10-CM | POA: Diagnosis not present

## 2019-02-16 DIAGNOSIS — D3131 Benign neoplasm of right choroid: Secondary | ICD-10-CM | POA: Diagnosis not present

## 2019-02-16 DIAGNOSIS — H52203 Unspecified astigmatism, bilateral: Secondary | ICD-10-CM | POA: Diagnosis not present

## 2019-02-16 DIAGNOSIS — Z23 Encounter for immunization: Secondary | ICD-10-CM | POA: Insufficient documentation

## 2019-02-16 NOTE — Progress Notes (Signed)
   Covid-19 Vaccination Clinic  Name:  Mackenzie Shaw    MRN: [id number] DOB: 12-26-1934  02/16/2019  [name] was observed post Covid-19 immunization for 15 minutes without incidence. She was provided with Vaccine Information Sheet and instruction to access the V-Safe system.   [name] was instructed to call 911 with any severe reactions post vaccine: Marland Kitchen Difficulty breathing  . Swelling of your face and throat  . A fast heartbeat  . A bad rash all over your body  . Dizziness and weakness    Immunizations Administered    Name Date Dose VIS Date Route   Pfizer COVID-19 Vaccine 02/16/2019  8:31 AM 0.3 mL 12/12/2018 Intramuscular   Manufacturer: [place]   Lot: [id number]   [place]: [id number]

## 2019-08-26 ENCOUNTER — Other Ambulatory Visit: Payer: Self-pay | Admitting: Geriatric Medicine

## 2019-08-26 ENCOUNTER — Ambulatory Visit
Admission: RE | Admit: 2019-08-26 | Discharge: 2019-08-26 | Disposition: A | Discharge status: Home / Self Care | Payer: PPO | Source: Ambulatory Visit | Attending: Geriatric Medicine | Admitting: Geriatric Medicine

## 2019-08-26 DIAGNOSIS — M545 Low back pain, unspecified: Secondary | ICD-10-CM

## 2019-08-26 DIAGNOSIS — D7589 Other specified diseases of blood and blood-forming organs: Secondary | ICD-10-CM | POA: Diagnosis not present

## 2019-08-26 DIAGNOSIS — Z1389 Encounter for screening for other disorder: Secondary | ICD-10-CM | POA: Diagnosis not present

## 2019-08-26 DIAGNOSIS — Z0000 Encounter for general adult medical examination without abnormal findings: Secondary | ICD-10-CM | POA: Diagnosis not present

## 2019-08-26 DIAGNOSIS — E041 Nontoxic single thyroid nodule: Secondary | ICD-10-CM | POA: Diagnosis not present

## 2019-08-26 DIAGNOSIS — E039 Hypothyroidism, unspecified: Secondary | ICD-10-CM | POA: Diagnosis not present

## 2019-08-26 DIAGNOSIS — Z79899 Other long term (current) drug therapy: Secondary | ICD-10-CM | POA: Diagnosis not present

## 2019-08-26 DIAGNOSIS — M5136 Other intervertebral disc degeneration, lumbar region: Secondary | ICD-10-CM | POA: Diagnosis not present

## 2019-09-01 DIAGNOSIS — M545 Low back pain: Secondary | ICD-10-CM | POA: Diagnosis not present

## 2019-09-01 DIAGNOSIS — G8929 Other chronic pain: Secondary | ICD-10-CM | POA: Diagnosis not present

## 2019-09-01 DIAGNOSIS — M8588 Other specified disorders of bone density and structure, other site: Secondary | ICD-10-CM | POA: Diagnosis not present

## 2019-09-25 DIAGNOSIS — M545 Low back pain: Secondary | ICD-10-CM | POA: Diagnosis not present

## 2019-10-13 ENCOUNTER — Ambulatory Visit: Payer: PPO | Attending: Internal Medicine

## 2019-10-13 DIAGNOSIS — Z23 Encounter for immunization: Secondary | ICD-10-CM

## 2019-10-13 NOTE — Progress Notes (Signed)
   Covid-19 Vaccination Clinic  Name:  Mackenzie Shaw    MRN: [id number] DOB: February 16, 1934  10/13/2019  Ms. Mackenzie Shaw was observed post Covid-19 immunization for 15 minutes without incident. She was provided with Vaccine Information Sheet and instruction to access the V-Safe system.   Mackenzie Shaw was instructed to call 911 with any severe reactions post vaccine: Marland Kitchen Difficulty breathing  . Swelling of face and throat  . A fast heartbeat  . A bad rash all over body  . Dizziness and weakness

## 2019-10-14 DIAGNOSIS — M545 Low back pain, unspecified: Secondary | ICD-10-CM | POA: Diagnosis not present

## 2019-10-28 DIAGNOSIS — S50862A Insect bite (nonvenomous) of left forearm, initial encounter: Secondary | ICD-10-CM | POA: Diagnosis not present

## 2019-11-07 DIAGNOSIS — Z23 Encounter for immunization: Secondary | ICD-10-CM | POA: Diagnosis not present

## 2020-01-12 DIAGNOSIS — Z77122 Contact with and (suspected) exposure to noise: Secondary | ICD-10-CM | POA: Diagnosis not present

## 2020-01-12 DIAGNOSIS — Z822 Family history of deafness and hearing loss: Secondary | ICD-10-CM | POA: Diagnosis not present

## 2020-01-12 DIAGNOSIS — H903 Sensorineural hearing loss, bilateral: Secondary | ICD-10-CM | POA: Diagnosis not present

## 2020-02-23 DIAGNOSIS — Z961 Presence of intraocular lens: Secondary | ICD-10-CM | POA: Diagnosis not present

## 2020-02-23 DIAGNOSIS — H52203 Unspecified astigmatism, bilateral: Secondary | ICD-10-CM | POA: Diagnosis not present

## 2020-02-23 DIAGNOSIS — D3131 Benign neoplasm of right choroid: Secondary | ICD-10-CM | POA: Diagnosis not present

## 2020-03-15 DIAGNOSIS — R634 Abnormal weight loss: Secondary | ICD-10-CM | POA: Diagnosis not present

## 2020-03-15 DIAGNOSIS — M545 Low back pain, unspecified: Secondary | ICD-10-CM | POA: Diagnosis not present

## 2020-03-15 DIAGNOSIS — F439 Reaction to severe stress, unspecified: Secondary | ICD-10-CM | POA: Diagnosis not present

## 2020-04-06 DIAGNOSIS — H43811 Vitreous degeneration, right eye: Secondary | ICD-10-CM | POA: Diagnosis not present

## 2020-04-13 DIAGNOSIS — H04121 Dry eye syndrome of right lacrimal gland: Secondary | ICD-10-CM | POA: Diagnosis not present

## 2020-05-23 DIAGNOSIS — H04121 Dry eye syndrome of right lacrimal gland: Secondary | ICD-10-CM | POA: Diagnosis not present

## 2020-06-06 ENCOUNTER — Other Ambulatory Visit: Payer: Self-pay

## 2020-06-06 ENCOUNTER — Other Ambulatory Visit (HOSPITAL_BASED_OUTPATIENT_CLINIC_OR_DEPARTMENT_OTHER): Payer: Self-pay

## 2020-06-06 ENCOUNTER — Ambulatory Visit: Payer: PPO | Attending: Internal Medicine

## 2020-06-06 DIAGNOSIS — Z23 Encounter for immunization: Secondary | ICD-10-CM

## 2020-06-06 MED ORDER — PFIZER-BIONT COVID-19 VAC-TRIS 30 MCG/0.3ML IM SUSP
INTRAMUSCULAR | 0 refills | Status: AC
  Filled 2020-06-06: qty 0.3, 1d supply, fill #0

## 2020-06-06 NOTE — Progress Notes (Signed)
   Covid-19 Vaccination Clinic  Name:  Mackenzie Shaw    MRN: [id number] DOB: 06/25/1934  06/06/2020  [name] was observed post Covid-19 immunization for 15 minutes without incident. She was provided with Vaccine Information Sheet and instruction to access the V-Safe system.   [name] was instructed to call 911 with any severe reactions post vaccine: Marland Kitchen Difficulty breathing  . Swelling of face and throat  . A fast heartbeat  . A bad rash all over body  . Dizziness and weakness   Immunizations Administered    Name Date Dose VIS Date Route   PFIZER Comrnaty(Gray TOP) Covid-19 Vaccine 06/06/2020  1:44 PM 0.3 mL 12/10/2019 Intramuscular   Manufacturer: [place]   Lot: [id number]   [place]: [phone]

## 2020-07-27 DIAGNOSIS — Z85828 Personal history of other malignant neoplasm of skin: Secondary | ICD-10-CM | POA: Diagnosis not present

## 2020-07-27 DIAGNOSIS — L905 Scar conditions and fibrosis of skin: Secondary | ICD-10-CM | POA: Diagnosis not present

## 2020-07-27 DIAGNOSIS — L814 Other melanin hyperpigmentation: Secondary | ICD-10-CM | POA: Diagnosis not present

## 2020-07-27 DIAGNOSIS — L570 Actinic keratosis: Secondary | ICD-10-CM | POA: Diagnosis not present

## 2020-08-09 DIAGNOSIS — Z79899 Other long term (current) drug therapy: Secondary | ICD-10-CM | POA: Diagnosis not present

## 2020-08-09 DIAGNOSIS — E039 Hypothyroidism, unspecified: Secondary | ICD-10-CM | POA: Diagnosis not present

## 2020-08-09 DIAGNOSIS — K5901 Slow transit constipation: Secondary | ICD-10-CM | POA: Diagnosis not present

## 2020-08-09 DIAGNOSIS — R634 Abnormal weight loss: Secondary | ICD-10-CM | POA: Diagnosis not present

## 2020-09-20 DIAGNOSIS — K5901 Slow transit constipation: Secondary | ICD-10-CM | POA: Diagnosis not present

## 2020-09-20 DIAGNOSIS — Z23 Encounter for immunization: Secondary | ICD-10-CM | POA: Diagnosis not present

## 2020-09-27 DIAGNOSIS — K5901 Slow transit constipation: Secondary | ICD-10-CM | POA: Diagnosis not present

## 2020-10-17 ENCOUNTER — Other Ambulatory Visit: Payer: Self-pay

## 2020-10-17 ENCOUNTER — Ambulatory Visit: Payer: PPO | Attending: Internal Medicine

## 2020-10-17 ENCOUNTER — Other Ambulatory Visit (HOSPITAL_BASED_OUTPATIENT_CLINIC_OR_DEPARTMENT_OTHER): Payer: Self-pay

## 2020-10-17 DIAGNOSIS — Z23 Encounter for immunization: Secondary | ICD-10-CM

## 2020-10-17 MED ORDER — PFIZER COVID-19 VAC BIVALENT 30 MCG/0.3ML IM SUSP
INTRAMUSCULAR | 0 refills | Status: AC
  Filled 2020-10-17: qty 0.3, 1d supply, fill #0

## 2020-10-17 NOTE — Progress Notes (Signed)
   Covid-19 Vaccination Clinic  Name:  Mackenzie Shaw    MRN: [id number] DOB: 1934-12-19  10/17/2020  Ms. Mackenzie Shaw was observed post Covid-19 immunization for 15 minutes without incident. She was provided with Vaccine Information Sheet and instruction to access the V-Safe system.   Ms. Mackenzie Shaw was instructed to call 911 with any severe reactions post vaccine: Difficulty breathing  Swelling of face and throat  A fast heartbeat  A bad rash all over body  Dizziness and weakness

## 2020-10-18 DIAGNOSIS — K5901 Slow transit constipation: Secondary | ICD-10-CM | POA: Diagnosis not present

## 2020-11-01 ENCOUNTER — Other Ambulatory Visit: Payer: Self-pay | Admitting: Geriatric Medicine

## 2020-11-01 DIAGNOSIS — Z1231 Encounter for screening mammogram for malignant neoplasm of breast: Secondary | ICD-10-CM

## 2020-12-07 ENCOUNTER — Ambulatory Visit
Admission: RE | Admit: 2020-12-07 | Discharge: 2020-12-07 | Disposition: A | Discharge status: Home / Self Care | Payer: PPO | Source: Ambulatory Visit | Attending: Geriatric Medicine | Admitting: Geriatric Medicine

## 2020-12-07 ENCOUNTER — Other Ambulatory Visit: Payer: Self-pay

## 2020-12-07 DIAGNOSIS — Z1231 Encounter for screening mammogram for malignant neoplasm of breast: Secondary | ICD-10-CM | POA: Diagnosis not present

## 2021-01-10 ENCOUNTER — Other Ambulatory Visit: Payer: Self-pay | Admitting: Geriatric Medicine

## 2021-01-10 DIAGNOSIS — E039 Hypothyroidism, unspecified: Secondary | ICD-10-CM | POA: Diagnosis not present

## 2021-01-10 DIAGNOSIS — I7300 Raynaud's syndrome without gangrene: Secondary | ICD-10-CM | POA: Diagnosis not present

## 2021-01-10 DIAGNOSIS — K5901 Slow transit constipation: Secondary | ICD-10-CM | POA: Diagnosis not present

## 2021-01-10 DIAGNOSIS — Z79899 Other long term (current) drug therapy: Secondary | ICD-10-CM | POA: Diagnosis not present

## 2021-01-10 DIAGNOSIS — R1904 Left lower quadrant abdominal swelling, mass and lump: Secondary | ICD-10-CM | POA: Diagnosis not present

## 2021-01-10 DIAGNOSIS — Z0000 Encounter for general adult medical examination without abnormal findings: Secondary | ICD-10-CM | POA: Diagnosis not present

## 2021-01-10 DIAGNOSIS — R030 Elevated blood-pressure reading, without diagnosis of hypertension: Secondary | ICD-10-CM | POA: Diagnosis not present

## 2021-01-31 ENCOUNTER — Ambulatory Visit
Admission: RE | Admit: 2021-01-31 | Discharge: 2021-01-31 | Disposition: A | Discharge status: Home / Self Care | Payer: PPO | Source: Ambulatory Visit | Attending: Geriatric Medicine | Admitting: Geriatric Medicine

## 2021-01-31 DIAGNOSIS — K7689 Other specified diseases of liver: Secondary | ICD-10-CM | POA: Diagnosis not present

## 2021-01-31 DIAGNOSIS — R1904 Left lower quadrant abdominal swelling, mass and lump: Secondary | ICD-10-CM

## 2021-01-31 DIAGNOSIS — M47816 Spondylosis without myelopathy or radiculopathy, lumbar region: Secondary | ICD-10-CM | POA: Diagnosis not present

## 2021-01-31 DIAGNOSIS — N281 Cyst of kidney, acquired: Secondary | ICD-10-CM | POA: Diagnosis not present

## 2021-01-31 DIAGNOSIS — K5900 Constipation, unspecified: Secondary | ICD-10-CM | POA: Diagnosis not present

## 2021-01-31 MED ORDER — IOPAMIDOL (ISOVUE-300) INJECTION 61%
100.0000 mL | Freq: Once | INTRAVENOUS | Status: AC | PRN
  Administered 2021-01-31: 100 mL via INTRAVENOUS

## 2021-02-07 DIAGNOSIS — I700 Atherosclerosis of aorta: Secondary | ICD-10-CM | POA: Diagnosis not present

## 2021-02-07 DIAGNOSIS — K5901 Slow transit constipation: Secondary | ICD-10-CM | POA: Diagnosis not present

## 2021-02-24 DIAGNOSIS — H52203 Unspecified astigmatism, bilateral: Secondary | ICD-10-CM | POA: Diagnosis not present

## 2021-02-24 DIAGNOSIS — D485 Neoplasm of uncertain behavior of skin: Secondary | ICD-10-CM | POA: Diagnosis not present

## 2021-02-24 DIAGNOSIS — D3131 Benign neoplasm of right choroid: Secondary | ICD-10-CM | POA: Diagnosis not present

## 2021-02-24 DIAGNOSIS — Z961 Presence of intraocular lens: Secondary | ICD-10-CM | POA: Diagnosis not present

## 2021-03-22 ENCOUNTER — Other Ambulatory Visit: Payer: Self-pay | Admitting: Geriatric Medicine

## 2021-03-22 ENCOUNTER — Ambulatory Visit
Admission: RE | Admit: 2021-03-22 | Discharge: 2021-03-22 | Disposition: A | Discharge status: Home / Self Care | Payer: PPO | Source: Ambulatory Visit | Attending: Geriatric Medicine | Admitting: Geriatric Medicine

## 2021-03-22 DIAGNOSIS — M5136 Other intervertebral disc degeneration, lumbar region: Secondary | ICD-10-CM | POA: Diagnosis not present

## 2021-03-22 DIAGNOSIS — M545 Low back pain, unspecified: Secondary | ICD-10-CM

## 2021-03-22 DIAGNOSIS — G8929 Other chronic pain: Secondary | ICD-10-CM | POA: Diagnosis not present

## 2021-03-22 DIAGNOSIS — M2578 Osteophyte, vertebrae: Secondary | ICD-10-CM | POA: Diagnosis not present

## 2021-04-03 DIAGNOSIS — M545 Low back pain, unspecified: Secondary | ICD-10-CM | POA: Diagnosis not present

## 2021-04-03 DIAGNOSIS — G8929 Other chronic pain: Secondary | ICD-10-CM | POA: Diagnosis not present

## 2021-04-19 DIAGNOSIS — M545 Low back pain, unspecified: Secondary | ICD-10-CM | POA: Diagnosis not present

## 2021-05-01 DIAGNOSIS — M545 Low back pain, unspecified: Secondary | ICD-10-CM | POA: Diagnosis not present

## 2021-06-07 DIAGNOSIS — Z8739 Personal history of other diseases of the musculoskeletal system and connective tissue: Secondary | ICD-10-CM | POA: Diagnosis not present

## 2021-06-07 DIAGNOSIS — K5901 Slow transit constipation: Secondary | ICD-10-CM | POA: Diagnosis not present

## 2021-07-07 DIAGNOSIS — S0003XA Contusion of scalp, initial encounter: Secondary | ICD-10-CM | POA: Diagnosis not present

## 2021-07-18 DIAGNOSIS — C44311 Basal cell carcinoma of skin of nose: Secondary | ICD-10-CM | POA: Diagnosis not present

## 2021-07-18 DIAGNOSIS — Z85828 Personal history of other malignant neoplasm of skin: Secondary | ICD-10-CM | POA: Diagnosis not present

## 2021-07-18 DIAGNOSIS — D485 Neoplasm of uncertain behavior of skin: Secondary | ICD-10-CM | POA: Diagnosis not present

## 2021-07-21 DIAGNOSIS — M79605 Pain in left leg: Secondary | ICD-10-CM | POA: Diagnosis not present

## 2021-07-21 DIAGNOSIS — R030 Elevated blood-pressure reading, without diagnosis of hypertension: Secondary | ICD-10-CM | POA: Diagnosis not present

## 2021-07-27 DIAGNOSIS — Z85828 Personal history of other malignant neoplasm of skin: Secondary | ICD-10-CM | POA: Diagnosis not present

## 2021-07-27 DIAGNOSIS — C44311 Basal cell carcinoma of skin of nose: Secondary | ICD-10-CM | POA: Diagnosis not present

## 2021-08-09 DIAGNOSIS — L989 Disorder of the skin and subcutaneous tissue, unspecified: Secondary | ICD-10-CM | POA: Diagnosis not present

## 2021-08-09 DIAGNOSIS — L814 Other melanin hyperpigmentation: Secondary | ICD-10-CM | POA: Diagnosis not present

## 2021-08-09 DIAGNOSIS — D1801 Hemangioma of skin and subcutaneous tissue: Secondary | ICD-10-CM | POA: Diagnosis not present

## 2021-08-09 DIAGNOSIS — D2261 Melanocytic nevi of right upper limb, including shoulder: Secondary | ICD-10-CM | POA: Diagnosis not present

## 2021-08-09 DIAGNOSIS — Z85828 Personal history of other malignant neoplasm of skin: Secondary | ICD-10-CM | POA: Diagnosis not present

## 2021-08-09 DIAGNOSIS — D225 Melanocytic nevi of trunk: Secondary | ICD-10-CM | POA: Diagnosis not present

## 2021-08-09 DIAGNOSIS — D485 Neoplasm of uncertain behavior of skin: Secondary | ICD-10-CM | POA: Diagnosis not present

## 2021-08-09 DIAGNOSIS — D2371 Other benign neoplasm of skin of right lower limb, including hip: Secondary | ICD-10-CM | POA: Diagnosis not present

## 2021-08-09 DIAGNOSIS — L821 Other seborrheic keratosis: Secondary | ICD-10-CM | POA: Diagnosis not present

## 2021-08-09 DIAGNOSIS — D2271 Melanocytic nevi of right lower limb, including hip: Secondary | ICD-10-CM | POA: Diagnosis not present

## 2021-08-09 DIAGNOSIS — D2272 Melanocytic nevi of left lower limb, including hip: Secondary | ICD-10-CM | POA: Diagnosis not present

## 2021-08-11 DIAGNOSIS — R030 Elevated blood-pressure reading, without diagnosis of hypertension: Secondary | ICD-10-CM | POA: Diagnosis not present

## 2021-08-11 DIAGNOSIS — H00011 Hordeolum externum right upper eyelid: Secondary | ICD-10-CM | POA: Diagnosis not present

## 2021-08-11 DIAGNOSIS — G588 Other specified mononeuropathies: Secondary | ICD-10-CM | POA: Diagnosis not present

## 2021-08-28 DIAGNOSIS — G5732 Lesion of lateral popliteal nerve, left lower limb: Secondary | ICD-10-CM | POA: Diagnosis not present

## 2021-08-28 DIAGNOSIS — D485 Neoplasm of uncertain behavior of skin: Secondary | ICD-10-CM | POA: Diagnosis not present

## 2021-08-28 DIAGNOSIS — Z85828 Personal history of other malignant neoplasm of skin: Secondary | ICD-10-CM | POA: Diagnosis not present

## 2021-10-06 DIAGNOSIS — Z23 Encounter for immunization: Secondary | ICD-10-CM | POA: Diagnosis not present

## 2021-10-14 DIAGNOSIS — Z23 Encounter for immunization: Secondary | ICD-10-CM | POA: Diagnosis not present

## 2021-11-09 DIAGNOSIS — R030 Elevated blood-pressure reading, without diagnosis of hypertension: Secondary | ICD-10-CM | POA: Diagnosis not present

## 2022-01-25 ENCOUNTER — Other Ambulatory Visit: Payer: Self-pay | Admitting: Geriatric Medicine

## 2022-01-25 ENCOUNTER — Ambulatory Visit
Admission: RE | Admit: 2022-01-25 | Discharge: 2022-01-25 | Disposition: A | Discharge status: Home / Self Care | Payer: PPO | Source: Ambulatory Visit | Attending: Geriatric Medicine | Admitting: Geriatric Medicine

## 2022-01-25 DIAGNOSIS — Z1231 Encounter for screening mammogram for malignant neoplasm of breast: Secondary | ICD-10-CM

## 2022-04-10 IMAGING — CR DG LUMBAR SPINE COMPLETE 4+V
5 series · 5 of 5 positions shown · non-contrast
Comparison: X-ray 08/26/2019.

CLINICAL DATA: Chronic low back pain.

EXAM:
LUMBAR SPINE - COMPLETE 4+ VIEW

[t l-spine a.p.]
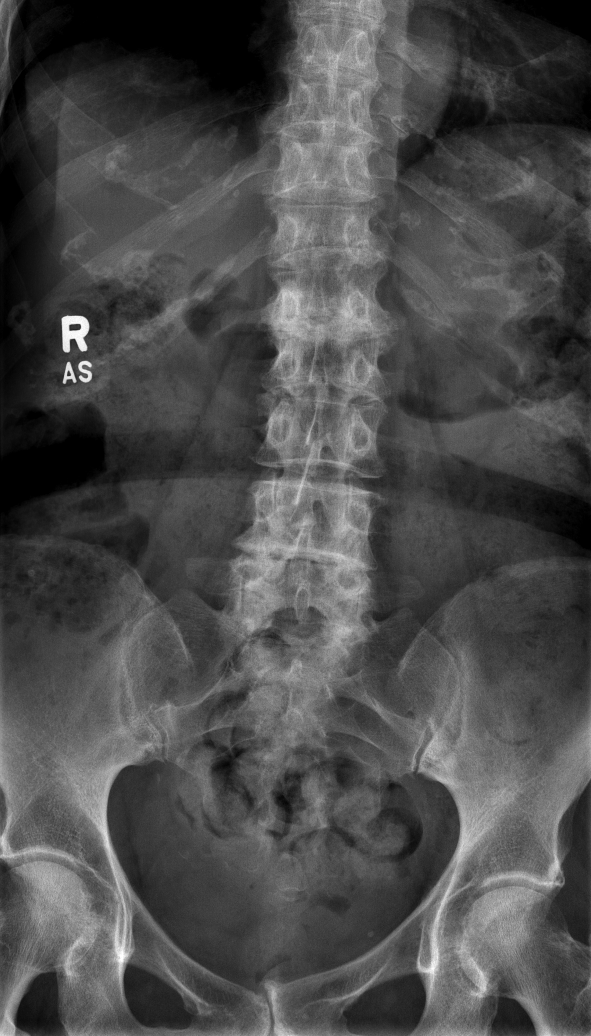

[t l-spine oblique exposure (1 of 2)]
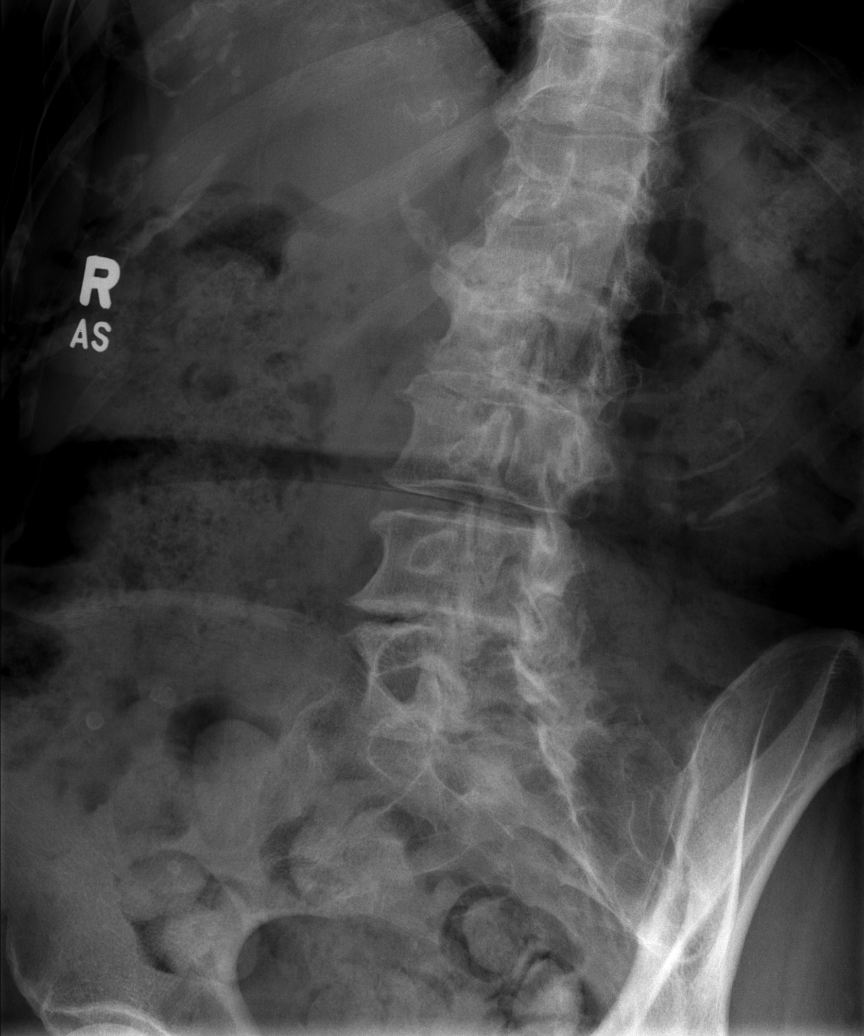

[t l-spine oblique exposure (2 of 2)]
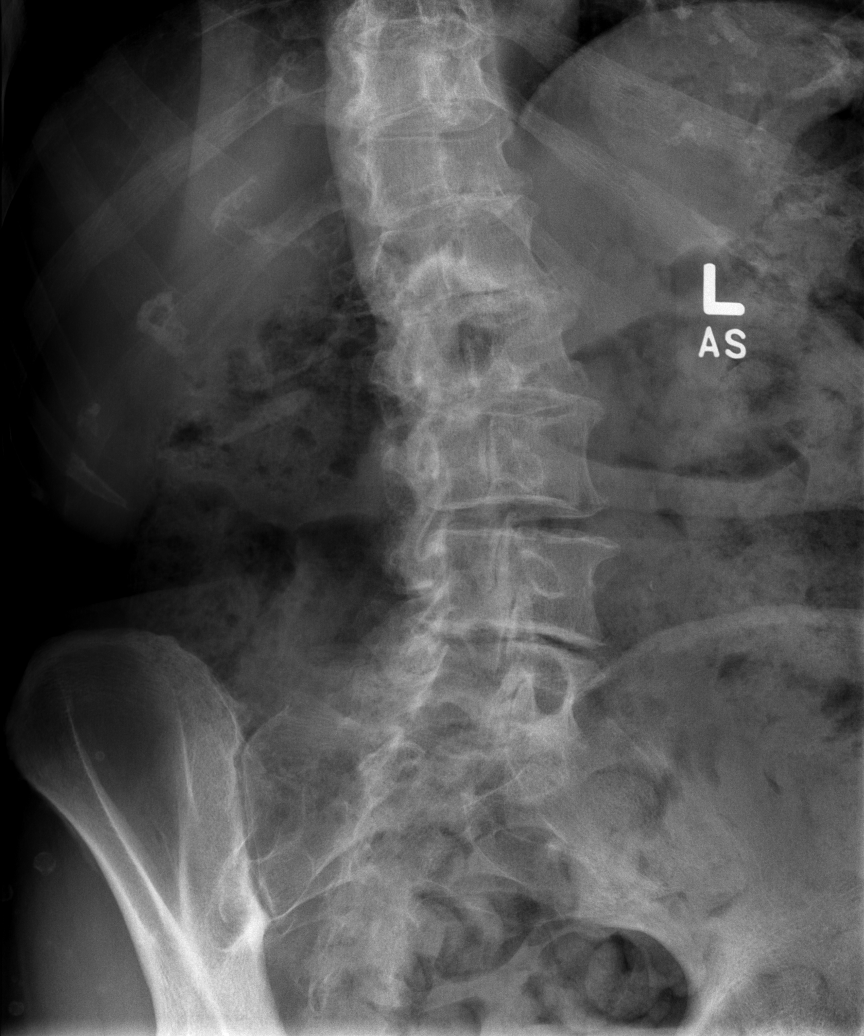

[t l-spine lat]
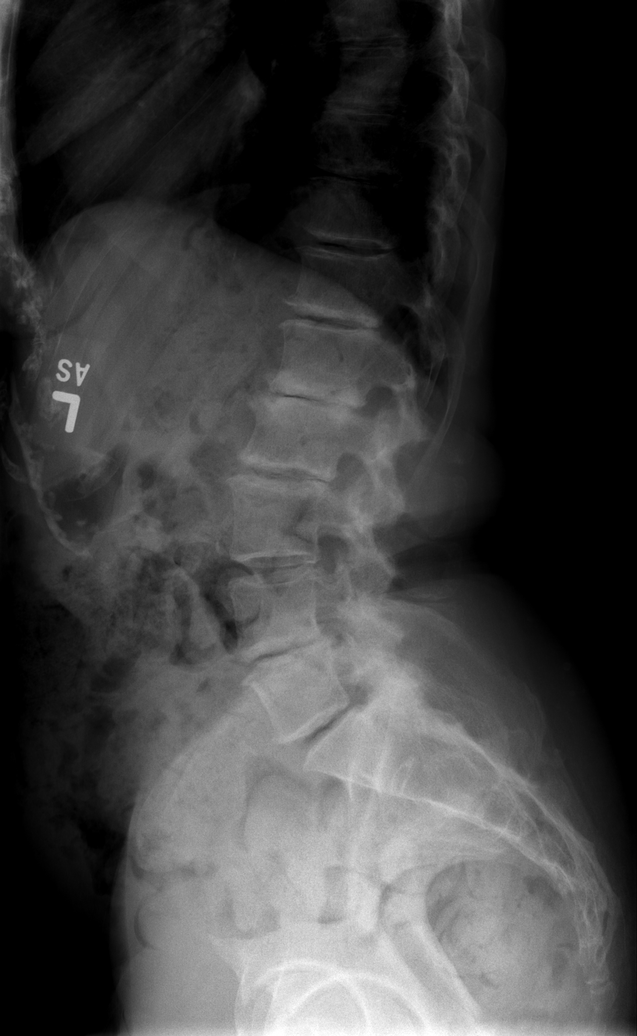

[t l-spine l5-s1 spot]
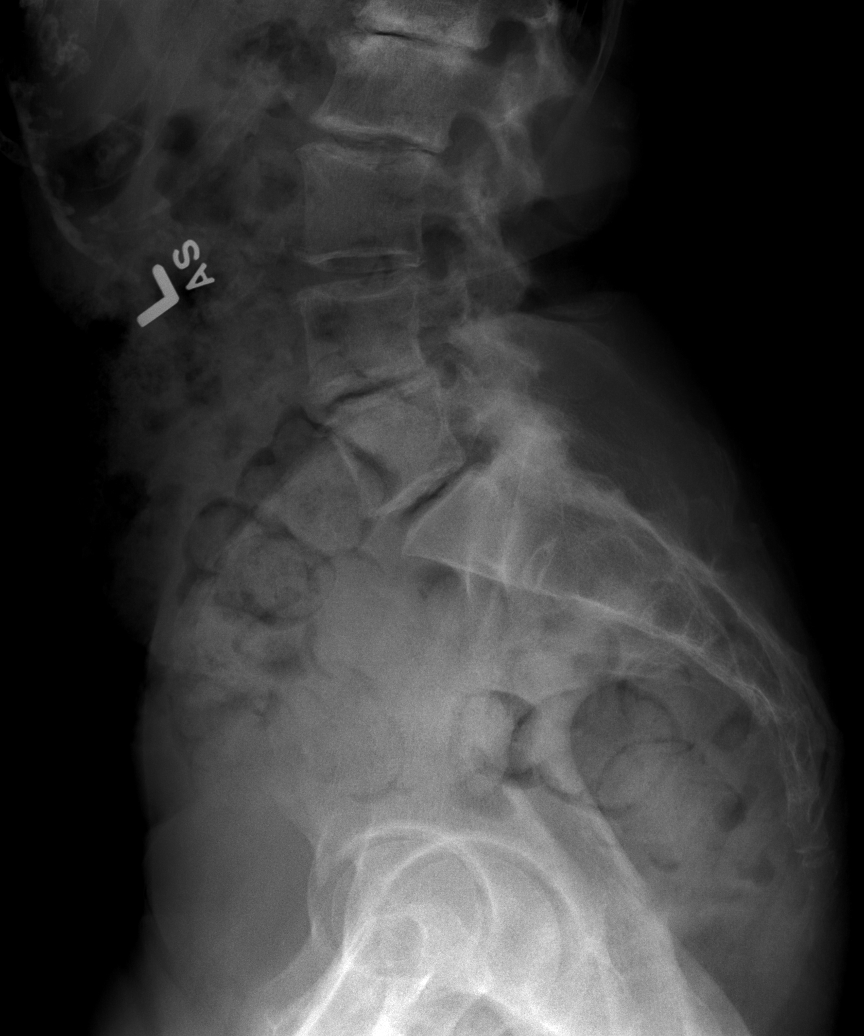

[5 of 5 positions shown; findings below may reference images not displayed]

FINDINGS: Mild scoliosis. Five non rib-bearing lumbar type vertebra. Vertebral
body heights are maintained. Moderate severe diffuse degenerative
changes throughout the lumbar spine with multiple level disc space
narrowing, endplate changes and osteophyte. Findings are grossly
similar compared with prior exam. There are facet degenerative
changes most evident at the lower lumbar spine.
IMPRESSION: Mild scoliosis and moderate severe degenerative changes without
significant change prior
